# Patient Record
Sex: Female | Born: 2005 | Race: White | Hispanic: No | Marital: Single | Smoking: Current some day smoker
Health system: Southern US, Community
[De-identification: ages and names within clinical notes are randomized; demographics above are authoritative.]

## PROBLEM LIST (undated history)

## (undated) DIAGNOSIS — K219 Gastro-esophageal reflux disease without esophagitis: Secondary | ICD-10-CM

## (undated) DIAGNOSIS — G43909 Migraine, unspecified, not intractable, without status migrainosus: Secondary | ICD-10-CM

## (undated) DIAGNOSIS — J45909 Unspecified asthma, uncomplicated: Secondary | ICD-10-CM

## (undated) HISTORY — PX: MOUTH SURGERY: SHX715

## (undated) HISTORY — PX: WISDOM TOOTH EXTRACTION: SHX21

---
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
## 2023-01-21 ENCOUNTER — Telehealth (INDEPENDENT_AMBULATORY_CARE_PROVIDER_SITE_OTHER): Payer: Self-pay | Admitting: Neurology

## 2023-01-21 ENCOUNTER — Encounter (INDEPENDENT_AMBULATORY_CARE_PROVIDER_SITE_OTHER): Payer: Self-pay

## 2023-01-21 DIAGNOSIS — R519 Headache, unspecified: Secondary | ICD-10-CM

## 2023-01-21 DIAGNOSIS — R413 Other amnesia: Secondary | ICD-10-CM

## 2023-01-21 DIAGNOSIS — G43011 Migraine without aura, intractable, with status migrainosus: Secondary | ICD-10-CM

## 2023-01-21 NOTE — Telephone Encounter (Signed)
Contacted patient. She has researched and would like to see or be referred to Pierce, DO with [facility], She is fine with medications right now.  Referral done.  B. Roten CMA

## 2023-01-21 NOTE — Telephone Encounter (Signed)
  Name of who is calling: Tabitha Oneill; self  Best contact number: [phone]  Provider they see: Dr.Nab  Reason for call: Katie lvm stating that Dr.Nab discussed transferring her to an adult neuro, but she is needing a referral to do so. She has requested a call back.    PRESCRIPTION REFILL ONLY  Name of prescription:  Pharmacy:

## 2023-01-22 ENCOUNTER — Encounter (INDEPENDENT_AMBULATORY_CARE_PROVIDER_SITE_OTHER): Payer: Self-pay

## 2023-05-07 ENCOUNTER — Ambulatory Visit: Payer: Self-pay | Admitting: Neurology

## 2024-09-21 DIAGNOSIS — G43909 Migraine, unspecified, not intractable, without status migrainosus: Secondary | ICD-10-CM | POA: Insufficient documentation

## 2024-11-27 DIAGNOSIS — L508 Other urticaria: Secondary | ICD-10-CM | POA: Insufficient documentation

## 2025-01-15 NOTE — Progress Notes (Signed)
 "  Office Visit Note  Patient: Tabitha Oneill             Date of Birth: [birth year]           MRN: [id number]             PCP: Penne Arland SQUIBB, PA-C Referring: Fleeta Smock, Lamar BROCKS, MD Visit Date: 01/16/2025 Occupation: Data Unavailable  Subjective:  New Patient (Initial Visit) (Patient states she will have episodes where she gets hives on her neck and her eyes will swell. Patient states her eyes and neck do itch. )   Discussed the use of AI scribe software for clinical note transcription with the patient, who gave verbal consent to proceed.  History of Present Illness   Tabitha Oneill is a 19 year old female who presents with chronic hives and joint pain for rheumatology evaluation. She was referred by Dr. Fleeta Smock from the [facility] for evaluation of hives and joint pain.  She has been experiencing chronic hives and swelling primarily affecting her eyes and lips, with episodes lasting three to four days and recurring every few weeks. The hives first appeared during her freshman year of college and have persisted for approximately three years, occurring in various environments without a clear trigger. Allergy testing, including tests for food allergies and environmental allergens, have been negative. She has been on Zyrtec 20 mg nightly, which has not effectively controlled her symptoms. She has also received prednisone  in December for a severe episode, which resolved the hives but not the itching.  Persistent itching has been present since November, particularly on her neck and eyes, with occasional itching on her arms and legs without visible hives. She has tried avoiding jewelry and makeup and using gentle products without relief. Swelling of the lips occurs occasionally but has never required an EpiPen . She has visited the ER for severe episodes and received steroid treatments.  Joint pain is reported, particularly in her fingers, making it difficult to open  and close her hands. She has not taken any medication for the joint pain. Her father has dermatomyositis.  She has a history of frequent infections, having been sick multiple times a month since high school. Recent testing showed low antibodies for the pneumococcal vaccine, which was re-administered. She has had COVID-19 three times, with the last infection occurring during her freshman year.  Significant hair loss is noted, with clumps being pulled out in the shower, which she attributes to COVID-19. No specific changes in her nails or major weight changes are noted. She experiences numbness and tingling in her hands, sometimes associated with migraines, which have decreased in frequency with treatment.  Her past medical history includes ACL surgery before her freshman year of college due to an injury. She engages in regular physical activity, going to the gym five times a week for weight training and cardio.       Activities of Daily Living:  Patient reports morning stiffness for all day.   Patient Denies nocturnal pain.  Difficulty dressing/grooming: Denies Difficulty climbing stairs: Denies Difficulty getting out of chair: Denies Difficulty using hands for taps, buttons, cutlery, and/or writing: Denies  Review of Systems  Constitutional:  Negative for fatigue.  HENT:  Negative for mouth sores and mouth dryness.   Eyes:  Negative for dryness.  Respiratory:  Negative for shortness of breath.   Cardiovascular:  Negative for chest pain and palpitations.  Gastrointestinal:  Negative for blood in stool, constipation and diarrhea.  Endocrine: Negative for increased urination.  Genitourinary:  Negative for involuntary urination.  Musculoskeletal:  Positive for joint pain, joint pain and morning stiffness. Negative for gait problem, joint swelling, myalgias, muscle weakness, muscle tenderness and myalgias.  Skin:  Positive for rash and hair loss. Negative for color change and sensitivity to  sunlight.  Allergic/Immunologic: Positive for susceptible to infections.  Neurological:  Positive for headaches. Negative for dizziness.  Hematological:  Negative for swollen glands.  Psychiatric/Behavioral:  Negative for depressed mood and sleep disturbance. The patient is not nervous/anxious.     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   Mild intermittent asthma 01/20/2022   Asthma exacerbation 07/25/2020    Past Medical History:  Diagnosis Date   Asthma    GERD (gastroesophageal reflux disease)    age 66   Migraines     Family History  Problem Relation Age of Onset   Diabetes Father    Dermatomyositis Father    Past Surgical History:  Procedure Laterality Date   ANTERIOR CRUCIATE LIGAMENT REPAIR Right 09/16/2022   Procedure: RIGHT KNEE ANTERIOR CRUCIATE LIGAMENT (ACL) RECONSTRUCTION and PARTIAL LATERAL MENISECTOMY;  Surgeon: Sheril Coy, MD;  Location: [facility];  Service: Orthopedics;  Laterality: Right;   MOUTH SURGERY     RHINOPLASTY  2024   SEPTOPLASTY  2024   WISDOM TOOTH EXTRACTION     Social History[1] Social History   Social History Narrative   Tabitha Oneill is a 19 year old female.   In the 12th grade at [employer].      Immunization History  Administered Date(s) Administered   INFLUENZA, HIGH DOSE SEASONAL PF 02/01/2018, 01/05/2020   PPD Test 07/26/2024     Objective: Vital Signs: BP 98/62 (BP Location: Right Arm, Patient Position: Sitting, Cuff Size: Normal)   Pulse 79   Temp 97.9 F (36.6 C)   Resp 14   Ht 5' 7.25 (1.708 m)   Wt 169 lb 6.4 oz (76.8 kg)   BMI 26.33 kg/m    Physical Exam Eyes:     Conjunctiva/sclera: Conjunctivae normal.  Cardiovascular:     Rate and Rhythm: Normal rate and regular rhythm.  Pulmonary:     Effort: Pulmonary effort is normal.     Breath sounds: Normal breath  sounds.  Musculoskeletal:     Right lower leg: No edema.     Left lower leg: No edema.  Lymphadenopathy:     Cervical: No cervical adenopathy.  Skin:    General: Skin is warm and dry.     Findings: No rash.  Neurological:     Mental Status: She is alert.  Psychiatric:        Mood and Affect: Mood normal.      Musculoskeletal Exam:  Shoulders full ROM no tenderness or swelling Elbows full ROM no tenderness or swelling Wrists full ROM no tenderness or swelling Fingers full ROM no tenderness or swelling Knees full ROM no tenderness or swelling Ankles full ROM no tenderness or swelling MTPs full ROM mild 5th MTPs bunionette with slight medial deviation    Investigation: No additional findings.  Imaging: No results found.  Recent Labs: Lab Results  Component Value Date   WBC 7.6 07/27/2020   HGB 11.0 (L) 07/27/2020   PLT 188 07/27/2020   NA 141 07/27/2020   K 3.7 07/27/2020   CL 111 07/27/2020  CO2 23 07/27/2020   GLUCOSE 93 07/27/2020   BUN 8 07/27/2020   CREATININE 0.59 07/27/2020   BILITOT 0.9 07/25/2020   ALKPHOS 65 07/25/2020   AST 24 07/25/2020   ALT 12 07/25/2020   PROT 6.5 07/25/2020   ALBUMIN 4.0 07/25/2020   CALCIUM 8.8 (L) 07/27/2020   GFRAA NOT CALCULATED 07/27/2020    Speciality Comments: No specialty comments available.  Procedures:  No procedures performed Allergies: Beclomethasone and Ondansetron    Assessment / Plan:     Visit Diagnoses:  Assessment & Plan Chronic urticaria Episodes of hives and swelling since 2023, primarily neck and eyes. Negative allergy tests. Differential includes autoimmune urticaria due to family history. Positive ANA in 30-50% of cases, specific autoimmune disease less common. Does not endorse other specific clinical criteria for systemic CTD. Also checking labs for autoimmune urticaria/histamine release. If labs normal would recommend conservative management such as antihistamine approach and no DMARD trial unless  refractory. - Ordered ANA antibodies test. - Ordered complements test. - Ordered rheumatoid factor and CCP antibody tests. Orders:   ANA Screen,IFA,Reflex Titer/Pattern,Reflex Mplx 11 Ab Cascade with IdentRA   C3 and C4   Histamine Rel. (Chronic Urticaria)  Polyarthralgia Intermittent soreness, difficulty in hand movement. No visible swelling. Screening for autoimmune conditions due to family history. - Ordered rheumatoid factor and CCP antibody tests. Orders:   Rheumatoid factor   Cyclic citrul peptide antibody, IgG   Sedimentation rate   Bunionette (Taylor's bunion) Bunionette with callus, likely due to high arches. No significant pain or impairment. - Advised on appropriate footwear.       Follow-Up Instructions: Return if symptoms worsen or fail to improve.   Lonni LELON Ester, MD  Note - This record has been created using [employer].  Chart creation errors have been sought, but may not always  have been located. Such creation errors do not reflect on  the standard of medical care.      [1]  Social History Tobacco Use   Smoking status: Some Days    Types: Cigarettes    Passive exposure: Never   Smokeless tobacco: Never  Vaping Use   Vaping status: Some Days  Substance Use Topics   Alcohol use: Yes   Drug use: Yes    Types: Marijuana   "

## 2025-01-16 ENCOUNTER — Ambulatory Visit: Attending: Internal Medicine | Admitting: Internal Medicine

## 2025-01-16 ENCOUNTER — Encounter: Payer: Self-pay | Admitting: Internal Medicine

## 2025-01-16 VITALS — BP 98/62 | HR 79 | Temp 97.9°F | Resp 14 | Ht 67.25 in | Wt 169.4 lb

## 2025-01-16 DIAGNOSIS — M2550 Pain in unspecified joint: Secondary | ICD-10-CM | POA: Diagnosis not present

## 2025-01-16 DIAGNOSIS — L508 Other urticaria: Secondary | ICD-10-CM | POA: Diagnosis not present

## 2025-01-16 NOTE — Assessment & Plan Note (Addendum)
 Intermittent soreness, difficulty in hand movement. No visible swelling. Screening for autoimmune conditions due to family history. - Ordered rheumatoid factor and CCP antibody tests. Orders:   Rheumatoid factor   Cyclic citrul peptide antibody, IgG   Sedimentation rate

## 2025-01-25 LAB — ANA SCREEN,IFA,REFLEX TITER/PATTERN,REFLEX MPLX 11 AB CASCADE
Anti Nuclear Antibody (ANA): NEGATIVE
Cyclic Citrullin Peptide Ab: <16 U
MUTATED CITRULLINATED VIMENTIN (MCV) AB: <20 U/mL
Rheumatoid fact SerPl-aCnc: <10 [IU]/mL

## 2025-01-25 LAB — C3 AND C4
C3 Complement: 114 mg/dL (ref 83–193)
C4 Complement: 22 mg/dL (ref 15–57)

## 2025-01-25 LAB — SEDIMENTATION RATE: Sed Rate: 14 mm/h (ref 0–20)

## 2025-01-25 LAB — HISTAMINE REL. (CHRONIC URTICARIA): Histamine Release: <16 %
